# Patient Record
Sex: Male | Born: 1970 | Race: White | Hispanic: No | Marital: Married | Smoking: Current every day smoker
Health system: Southern US, Community
[De-identification: ages and names within clinical notes are randomized; demographics above are authoritative.]

## PROBLEM LIST (undated history)

## (undated) DIAGNOSIS — N200 Calculus of kidney: Secondary | ICD-10-CM

## (undated) DIAGNOSIS — K5730 Diverticulosis of large intestine without perforation or abscess without bleeding: Secondary | ICD-10-CM

## (undated) HISTORY — PX: APPENDECTOMY: SHX54

## (undated) HISTORY — PX: HERNIA REPAIR: SHX51

## (undated) HISTORY — PX: SHOULDER SURGERY: SHX246

## (undated) HISTORY — PX: APPENDECTOMY (OPEN): SHX54

## (undated) HISTORY — PX: ROTATOR CUFF REPAIR: SHX139

---

## 2005-05-25 ENCOUNTER — Emergency Department: Admit: 2005-05-25 | Payer: Self-pay | Source: Emergency Department

## 2010-11-17 ENCOUNTER — Emergency Department (INDEPENDENT_AMBULATORY_CARE_PROVIDER_SITE_OTHER): Payer: PRIVATE HEALTH INSURANCE

## 2010-11-17 ENCOUNTER — Emergency Department (HOSPITAL_BASED_OUTPATIENT_CLINIC_OR_DEPARTMENT_OTHER)
Admission: EM | Admit: 2010-11-17 | Discharge: 2010-11-17 | Disposition: A | Discharge status: Home / Self Care | Payer: PRIVATE HEALTH INSURANCE | Attending: Emergency Medicine | Admitting: Emergency Medicine

## 2010-11-17 DIAGNOSIS — N200 Calculus of kidney: Secondary | ICD-10-CM | POA: Insufficient documentation

## 2010-11-17 DIAGNOSIS — R109 Unspecified abdominal pain: Secondary | ICD-10-CM | POA: Insufficient documentation

## 2010-11-17 DIAGNOSIS — F17200 Nicotine dependence, unspecified, uncomplicated: Secondary | ICD-10-CM | POA: Insufficient documentation

## 2010-11-17 DIAGNOSIS — R110 Nausea: Secondary | ICD-10-CM | POA: Insufficient documentation

## 2010-11-17 LAB — DIFFERENTIAL
Eosinophils Absolute: 0.4 10*3/uL (ref 0.0–0.7)
Lymphocytes Relative: 42 % (ref 12–46)
Lymphs Abs: 5.3 10*3/uL — ABNORMAL HIGH (ref 0.7–4.0)
Monocytes Relative: 11 % (ref 3–12)
Neutrophils Relative %: 43 % (ref 43–77)

## 2010-11-17 LAB — URINALYSIS, ROUTINE W REFLEX MICROSCOPIC
Bilirubin Urine: NEGATIVE
Ketones, ur: NEGATIVE mg/dL
Leukocytes, UA: NEGATIVE
Protein, ur: NEGATIVE mg/dL
Urine Glucose, Fasting: NEGATIVE mg/dL
pH: 5 (ref 5.0–8.0)

## 2010-11-17 LAB — BASIC METABOLIC PANEL
BUN: 22 mg/dL (ref 6–23)
CO2: 22 mEq/L (ref 19–32)
Chloride: 110 mEq/L (ref 96–112)
Creatinine, Ser: 1.2 mg/dL (ref 0.4–1.5)
Glucose, Bld: 154 mg/dL — ABNORMAL HIGH (ref 70–99)
Potassium: 3.9 mEq/L (ref 3.5–5.1)

## 2010-11-17 LAB — CBC
HCT: 42.3 % (ref 39.0–52.0)
MCH: 30.4 pg (ref 26.0–34.0)
MCV: 88.1 fL (ref 78.0–100.0)
RBC: 4.8 MIL/uL (ref 4.22–5.81)
WBC: 12.5 10*3/uL — ABNORMAL HIGH (ref 4.0–10.5)

## 2010-11-17 LAB — URINE MICROSCOPIC-ADD ON

## 2011-09-29 ENCOUNTER — Emergency Department (INDEPENDENT_AMBULATORY_CARE_PROVIDER_SITE_OTHER)

## 2011-09-29 ENCOUNTER — Emergency Department (HOSPITAL_BASED_OUTPATIENT_CLINIC_OR_DEPARTMENT_OTHER)
Admission: EM | Admit: 2011-09-29 | Discharge: 2011-09-29 | Disposition: A | Discharge status: Home / Self Care | Attending: Emergency Medicine | Admitting: Emergency Medicine

## 2011-09-29 ENCOUNTER — Encounter: Payer: Self-pay | Admitting: Emergency Medicine

## 2011-09-29 DIAGNOSIS — M25569 Pain in unspecified knee: Secondary | ICD-10-CM | POA: Insufficient documentation

## 2011-09-29 DIAGNOSIS — X500XXA Overexertion from strenuous movement or load, initial encounter: Secondary | ICD-10-CM

## 2011-09-29 DIAGNOSIS — F17200 Nicotine dependence, unspecified, uncomplicated: Secondary | ICD-10-CM | POA: Insufficient documentation

## 2011-09-29 MED ORDER — HYDROCODONE-ACETAMINOPHEN 5-325 MG PO TABS
1.0000 | ORAL_TABLET | Freq: Once | ORAL | Status: AC
  Administered 2011-09-29: 1 via ORAL
  Filled 2011-09-29: qty 1

## 2011-09-29 MED ORDER — IBUPROFEN 800 MG PO TABS
ORAL_TABLET | ORAL | Status: AC
  Administered 2011-09-29: 800 mg
  Filled 2011-09-29: qty 1

## 2011-09-29 NOTE — ED Notes (Signed)
Refused crutches knee immobilizer applied with increased comfort

## 2011-09-29 NOTE — ED Notes (Signed)
Care plan and follow up reviewed 

## 2011-09-29 NOTE — ED Notes (Signed)
Pt ambulatory with pain to L knee this AM denies any trauma

## 2011-09-29 NOTE — ED Provider Notes (Signed)
History     CSN: [id number]  Arrival date & time 09/29/11  0805   First MD Initiated Contact with Patient 09/29/11 [phone]      Chief Complaint  Patient presents with  . Knee Pain    L knee pain after standing from squating position    (Consider location/radiation/quality/duration/timing/severity/associated sxs/prior treatment) HPI  40yoM presents with knee pain. Patient states that he was in a kneeling position last night at work and stood up on his left lower extremity and began to experience severe pain in his left knee. He rates pain as 5/10 at this time, inferolateral aspect of left knee. The pain is worse with walking. He denies numbness, tingling, weakness of his extremities. No history of prior injury. He is not immunocompromised and has not take any steroids. No history of arthritis. He has tried nothing at home for pain relief prior to arrival. He does have prescriptions for ibuprofen and Vicodin at home from  previous shoulder injury. He did not fall or have other injury.  History reviewed. No pertinent past medical history.  Past Surgical History  Procedure Date  . Shoulder surgery   . Hernia repair   . Appendectomy     History reviewed. No pertinent family history.  History  Substance Use Topics  . Smoking status: Current Everyday Smoker  . Smokeless tobacco: Not on file  . Alcohol Use: No      Review of Systems  All other systems reviewed and are negative.   except as noted HPI   Allergies  Review of patient's allergies indicates no known allergies.  Home Medications   Current Outpatient Rx  Name Route Sig Dispense Refill  . HYDROCODONE-ACETAMINOPHEN 5-325 MG PO TABS Oral Take 1 tablet by mouth every 6 (six) hours as needed.      . IBUPROFEN 800 MG PO TABS Oral Take 800 mg by mouth every 8 (eight) hours as needed.        BP 125/80  Pulse 78  Temp 98.6 F (37 C)  Resp 20  SpO2 99%  Physical Exam  Nursing note and vitals  reviewed. Constitutional: He is oriented to person, place, and time. He appears well-developed and well-nourished. No distress.  HENT:  Head: Atraumatic.  Mouth/Throat: Oropharynx is clear and moist.  Eyes: Conjunctivae are normal. Pupils are equal, round, and reactive to light.  Neck: Neck supple.  Cardiovascular: Normal rate, regular rhythm, normal heart sounds and intact distal pulses.  Exam reveals no gallop and no friction rub.   No murmur heard. Pulmonary/Chest: Effort normal. No respiratory distress. He has no wheezes. He has no rales.  Abdominal: Soft. Bowel sounds are normal. There is no tenderness. There is no rebound and no guarding.  Musculoskeletal: Normal range of motion. He exhibits no edema and no tenderness.       Lt knee without ecchymosis or deformity. No ttp. Full ROM with min pain. No crepitus. A/P drawer test unremarkable. Distal pulses intact. Gross sensation intact. Strength 5 out of 5 throughout left lower extremity. Capillary refill less than 3 seconds.  Neurological: He is alert and oriented to person, place, and time.  Skin: Skin is warm and dry.  Psychiatric: He has a normal mood and affect.    ED Course  Procedures (including critical care time)  Labs Reviewed - No data to display Dg Knee Complete 4 Views Left  09/29/2011  *RADIOLOGY REPORT*  Clinical Data: Left knee pain.  Twisting injury.  LEFT KNEE - COMPLETE 4+  VIEW  Comparison: None  Findings: No acute bony abnormality.  Specifically, no fracture, subluxation, or dislocation.  Soft tissues are intact.  No significant joint effusion.  IMPRESSION: No acute bony abnormality.  Original Report Authenticated By: Cyndie Chime, M.D.     1. Knee pain     MDM  XR unremarkable as above. Knee immobilizer, crutches, pain control. F/U with ortho or PMD if sx persist for possible MRI to evaluate ligaments/tendons/meninscus.        Forbes Cellar, MD 09/29/11 [phone]

## 2012-07-21 ENCOUNTER — Encounter (HOSPITAL_BASED_OUTPATIENT_CLINIC_OR_DEPARTMENT_OTHER): Payer: Self-pay | Admitting: *Deleted

## 2012-07-21 ENCOUNTER — Emergency Department (HOSPITAL_BASED_OUTPATIENT_CLINIC_OR_DEPARTMENT_OTHER)
Admission: EM | Admit: 2012-07-21 | Discharge: 2012-07-22 | Disposition: A | Discharge status: Home / Self Care | Payer: BC Managed Care – PPO | Attending: Emergency Medicine | Admitting: Emergency Medicine

## 2012-07-21 ENCOUNTER — Emergency Department (HOSPITAL_BASED_OUTPATIENT_CLINIC_OR_DEPARTMENT_OTHER): Payer: BC Managed Care – PPO

## 2012-07-21 DIAGNOSIS — K5792 Diverticulitis of intestine, part unspecified, without perforation or abscess without bleeding: Secondary | ICD-10-CM

## 2012-07-21 DIAGNOSIS — K5732 Diverticulitis of large intestine without perforation or abscess without bleeding: Secondary | ICD-10-CM | POA: Insufficient documentation

## 2012-07-21 DIAGNOSIS — R1032 Left lower quadrant pain: Secondary | ICD-10-CM | POA: Insufficient documentation

## 2012-07-21 LAB — COMPREHENSIVE METABOLIC PANEL
ALT: 25 U/L (ref 0–53)
Albumin: 3.6 g/dL (ref 3.5–5.2)
Alkaline Phosphatase: 36 U/L — ABNORMAL LOW (ref 39–117)
Chloride: 104 mEq/L (ref 96–112)
Glucose, Bld: 106 mg/dL — ABNORMAL HIGH (ref 70–99)
Potassium: 3.8 mEq/L (ref 3.5–5.1)
Sodium: 138 mEq/L (ref 135–145)
Total Protein: 6.4 g/dL (ref 6.0–8.3)

## 2012-07-21 LAB — CBC WITH DIFFERENTIAL/PLATELET
Eosinophils Absolute: 0.3 10*3/uL (ref 0.0–0.7)
Lymphs Abs: 3.3 10*3/uL (ref 0.7–4.0)
MCH: 31 pg (ref 26.0–34.0)
Neutro Abs: 6.9 10*3/uL (ref 1.7–7.7)
Neutrophils Relative %: 60 % (ref 43–77)
Platelets: 261 10*3/uL (ref 150–400)
RBC: 4.61 MIL/uL (ref 4.22–5.81)
WBC: 11.5 10*3/uL — ABNORMAL HIGH (ref 4.0–10.5)

## 2012-07-21 MED ORDER — MORPHINE SULFATE 4 MG/ML IJ SOLN
4.0000 mg | Freq: Once | INTRAMUSCULAR | Status: AC
  Administered 2012-07-21: 4 mg via INTRAVENOUS
  Filled 2012-07-21: qty 1

## 2012-07-21 MED ORDER — SODIUM CHLORIDE 0.9 % IV BOLUS (SEPSIS)
1000.0000 mL | Freq: Once | INTRAVENOUS | Status: AC
  Administered 2012-07-21: 1000 mL via INTRAVENOUS

## 2012-07-21 MED ORDER — ONDANSETRON HCL 4 MG/2ML IJ SOLN
4.0000 mg | Freq: Once | INTRAMUSCULAR | Status: AC
  Administered 2012-07-21: 4 mg via INTRAVENOUS
  Filled 2012-07-21: qty 2

## 2012-07-21 NOTE — ED Notes (Signed)
MD at bedside. 

## 2012-07-21 NOTE — ED Notes (Signed)
Drinking po contrast

## 2012-07-21 NOTE — ED Notes (Signed)
Left lower quad pain x 2 days. May be constipated per patient. No urinary complaints.

## 2012-07-22 LAB — URINALYSIS, ROUTINE W REFLEX MICROSCOPIC
Bilirubin Urine: NEGATIVE
Glucose, UA: NEGATIVE mg/dL
Ketones, ur: NEGATIVE mg/dL
pH: 5.5 (ref 5.0–8.0)

## 2012-07-22 MED ORDER — METRONIDAZOLE 500 MG PO TABS: 500.0000 mg | ORAL_TABLET | Freq: Two times a day (BID) | ORAL | Status: DC

## 2012-07-22 MED ORDER — IOHEXOL 300 MG/ML  SOLN
50.0000 mL | Freq: Once | INTRAMUSCULAR | Status: AC | PRN
  Administered 2012-07-22: 50 mL via ORAL

## 2012-07-22 MED ORDER — HYDROCODONE-ACETAMINOPHEN 5-500 MG PO TABS: 1.0000 | ORAL_TABLET | Freq: Four times a day (QID) | ORAL | Status: DC | PRN

## 2012-07-22 MED ORDER — METRONIDAZOLE 500 MG PO TABS
500.0000 mg | ORAL_TABLET | Freq: Once | ORAL | Status: AC
  Administered 2012-07-22: 500 mg via ORAL
  Filled 2012-07-22: qty 1

## 2012-07-22 MED ORDER — IOHEXOL 300 MG/ML  SOLN
100.0000 mL | Freq: Once | INTRAMUSCULAR | Status: AC | PRN
  Administered 2012-07-22: 100 mL via INTRAVENOUS

## 2012-07-22 MED ORDER — CIPROFLOXACIN HCL 500 MG PO TABS
500.0000 mg | ORAL_TABLET | Freq: Once | ORAL | Status: AC
  Administered 2012-07-22: 500 mg via ORAL
  Filled 2012-07-22: qty 1

## 2012-07-22 MED ORDER — CIPROFLOXACIN HCL 500 MG PO TABS: 500.0000 mg | ORAL_TABLET | Freq: Two times a day (BID) | ORAL | Status: DC

## 2012-07-22 NOTE — ED Notes (Signed)
Pt given a copy of his CT per MD request.

## 2012-07-22 NOTE — ED Notes (Signed)
Transported to CT 

## 2012-07-22 NOTE — ED Notes (Signed)
MD at bedside. 

## 2012-07-22 NOTE — ED Provider Notes (Signed)
History     CSN: [id number]  Arrival date & time 07/21/12  2208   First MD Initiated Contact with Patient 07/21/12 2300      Chief Complaint  Patient presents with  . Abdominal Pain    (Consider location/radiation/quality/duration/timing/severity/associated sxs/prior treatment) Patient is a 41 y.o. male presenting with abdominal pain. The history is provided by the patient.  Abdominal Pain The primary symptoms of the illness include abdominal pain. The primary symptoms of the illness do not include fever, nausea, vomiting or diarrhea. The current episode started 2 days ago. The onset of the illness was gradual. The problem has been gradually worsening.  The abdominal pain is located in the LLQ. The abdominal pain does not radiate. The severity of the abdominal pain is 6/10. The abdominal pain is relieved by nothing. The abdominal pain is exacerbated by eating.  The illness is associated with eating. The patient has not had a change in bowel habit. Symptoms associated with the illness do not include chills, anorexia, constipation, urgency, frequency or back pain. Significant associated medical issues do not include GERD. Associated medical issues comments: states that he has been having some "stomach problems".    History reviewed. No pertinent past medical history.  Past Surgical History  Procedure Date  . Shoulder surgery   . Hernia repair   . Appendectomy     No family history on file.  History  Substance Use Topics  . Smoking status: Current Every Day Smoker -- 0.5 packs/day  . Smokeless tobacco: Not on file  . Alcohol Use: No      Review of Systems  Constitutional: Negative for fever and chills.  Gastrointestinal: Positive for abdominal pain. Negative for nausea, vomiting, diarrhea, constipation and anorexia.  Genitourinary: Negative for urgency and frequency.  Musculoskeletal: Negative for back pain.  All other systems reviewed and are negative.    Allergies    Review of patient's allergies indicates no known allergies.  Home Medications   Current Outpatient Rx  Name Route Sig Dispense Refill  . CIPROFLOXACIN HCL 500 MG PO TABS Oral Take 1 tablet (500 mg total) by mouth every 12 (twelve) hours. 20 tablet 0  . HYDROCODONE-ACETAMINOPHEN 5-325 MG PO TABS Oral Take 1 tablet by mouth every 6 (six) hours as needed.      Marland Kitchen HYDROCODONE-ACETAMINOPHEN 5-500 MG PO TABS Oral Take 1-2 tablets by mouth every 6 (six) hours as needed for pain. 15 tablet 0  . IBUPROFEN 800 MG PO TABS Oral Take 800 mg by mouth every 8 (eight) hours as needed.      Marland Kitchen METRONIDAZOLE 500 MG PO TABS Oral Take 1 tablet (500 mg total) by mouth 2 (two) times daily. 20 tablet 0    BP 129/75  Pulse 74  Temp 98.2 F (36.8 C) (Oral)  Resp 18  SpO2 98%  Physical Exam  Nursing note and vitals reviewed. Constitutional: He is oriented to person, place, and time. He appears well-developed and well-nourished. No distress.  HENT:  Head: Normocephalic and atraumatic.  Mouth/Throat: Oropharynx is clear and moist.  Eyes: Conjunctivae normal and EOM are normal. Pupils are equal, round, and reactive to light.  Neck: Normal range of motion. Neck supple.  Cardiovascular: Normal rate, regular rhythm and intact distal pulses.   No murmur heard. Pulmonary/Chest: Effort normal and breath sounds normal. No respiratory distress. He has no wheezes. He has no rales.  Abdominal: Soft. Normal appearance. He exhibits no distension. There is tenderness in the left lower quadrant. There  is guarding. There is no rebound and no CVA tenderness.  Musculoskeletal: Normal range of motion. He exhibits no edema and no tenderness.  Neurological: He is alert and oriented to person, place, and time.  Skin: Skin is warm and dry. No rash noted. No erythema.  Psychiatric: He has a normal mood and affect. His behavior is normal.    ED Course  Procedures (including critical care time)  Labs Reviewed  CBC WITH  DIFFERENTIAL - Abnormal; Notable for the following:    WBC 11.5 (*)     All other components within normal limits  COMPREHENSIVE METABOLIC PANEL - Abnormal; Notable for the following:    Glucose, Bld 106 (*)     Alkaline Phosphatase 36 (*)     Total Bilirubin 0.2 (*)     GFR calc non Af Amer 81 (*)     All other components within normal limits  URINALYSIS, ROUTINE W REFLEX MICROSCOPIC   Ct Abdomen Pelvis W Contrast  07/22/2012  *RADIOLOGY REPORT*  Clinical Data: Left lower quadrant pain; question diverticulitis. Hernia repair and appendectomy.  CT ABDOMEN AND PELVIS WITH CONTRAST  Technique:  Multidetector CT imaging of the abdomen and pelvis was performed following the standard protocol during bolus administration of intravenous contrast.  Contrast: OMNIPAQUE IOHEXOL 300 MG/ML  SOLN, 50mL OMNIPAQUE IOHEXOL 300 MG/ML  SOLN  Comparison: 11/17/2010  Findings: Clear lung bases.  Mild cardiomegaly, without pericardial or pleural effusion.  Normal liver, spleen.  A splenule.  Normal stomach, pancreas, gallbladder, biliary tract, adrenal glands. Lower pole right renal cyst.  Resolution of right-sided hydronephrosis.  Small retroperitoneal nodes which are not significantly changed and likely reactive.  Minimal diverticulosis.  Inflammation involving the distal descending colon, including on image 65.  No drainable abscess or evidence of free perforation. Normal terminal ileum.  Appendix surgically absent. Normal small bowel without abdominal ascites.    No pelvic adenopathy.    Normal urinary bladder and prostate.  No significant free fluid.  No acute osseous abnormality.  IMPRESSION: Uncomplicated descending diverticulitis.   Original Report Authenticated By: Consuello Bossier, M.D.      1. Diverticulitis       MDM   Patient with abdominal pain in the left lower quadrant symptoms concerning for diverticulitis. Labs show a mild leukocytosis of 11,000 but otherwise normal hemoglobin normal liver  enzymes and normal UA. CT showed uncomplicated diverticulitis. Patient was placed on Cipro and Flagyl and given pain control. He is currently not had any vomiting and is tolerating by mouth's. He has followup with GI on October 25 and was given strict return precautions        Gwyneth Sprout, MD 07/22/12 0205

## 2012-07-22 NOTE — ED Notes (Signed)
Returned from CT.

## 2013-01-26 ENCOUNTER — Other Ambulatory Visit: Payer: Self-pay | Admitting: Gastroenterology

## 2013-01-26 DIAGNOSIS — R1032 Left lower quadrant pain: Secondary | ICD-10-CM

## 2013-01-27 ENCOUNTER — Ambulatory Visit
Admission: RE | Admit: 2013-01-27 | Discharge: 2013-01-27 | Disposition: A | Discharge status: Home / Self Care | Payer: Federal, State, Local not specified - PPO | Source: Ambulatory Visit | Attending: Gastroenterology | Admitting: Gastroenterology

## 2013-01-27 DIAGNOSIS — R1032 Left lower quadrant pain: Secondary | ICD-10-CM

## 2013-01-27 MED ORDER — IOHEXOL 300 MG/ML  SOLN
125.0000 mL | Freq: Once | INTRAMUSCULAR | Status: AC | PRN
  Administered 2013-01-27: 125 mL via INTRAVENOUS

## 2013-05-27 ENCOUNTER — Emergency Department (HOSPITAL_BASED_OUTPATIENT_CLINIC_OR_DEPARTMENT_OTHER): Payer: Federal, State, Local not specified - PPO

## 2013-05-27 ENCOUNTER — Emergency Department (HOSPITAL_BASED_OUTPATIENT_CLINIC_OR_DEPARTMENT_OTHER)
Admission: EM | Admit: 2013-05-27 | Discharge: 2013-05-27 | Disposition: A | Discharge status: Home / Self Care | Payer: Federal, State, Local not specified - PPO | Attending: Emergency Medicine | Admitting: Emergency Medicine

## 2013-05-27 ENCOUNTER — Encounter (HOSPITAL_BASED_OUTPATIENT_CLINIC_OR_DEPARTMENT_OTHER): Payer: Self-pay | Admitting: *Deleted

## 2013-05-27 DIAGNOSIS — R197 Diarrhea, unspecified: Secondary | ICD-10-CM | POA: Insufficient documentation

## 2013-05-27 DIAGNOSIS — F17200 Nicotine dependence, unspecified, uncomplicated: Secondary | ICD-10-CM | POA: Insufficient documentation

## 2013-05-27 DIAGNOSIS — R112 Nausea with vomiting, unspecified: Secondary | ICD-10-CM | POA: Insufficient documentation

## 2013-05-27 DIAGNOSIS — Z791 Long term (current) use of non-steroidal anti-inflammatories (NSAID): Secondary | ICD-10-CM | POA: Insufficient documentation

## 2013-05-27 DIAGNOSIS — N200 Calculus of kidney: Secondary | ICD-10-CM | POA: Insufficient documentation

## 2013-05-27 DIAGNOSIS — R630 Anorexia: Secondary | ICD-10-CM | POA: Insufficient documentation

## 2013-05-27 DIAGNOSIS — Z8719 Personal history of other diseases of the digestive system: Secondary | ICD-10-CM | POA: Insufficient documentation

## 2013-05-27 DIAGNOSIS — R300 Dysuria: Secondary | ICD-10-CM | POA: Insufficient documentation

## 2013-05-27 DIAGNOSIS — Z79899 Other long term (current) drug therapy: Secondary | ICD-10-CM | POA: Insufficient documentation

## 2013-05-27 DIAGNOSIS — M549 Dorsalgia, unspecified: Secondary | ICD-10-CM | POA: Insufficient documentation

## 2013-05-27 HISTORY — DX: Diverticulosis of large intestine without perforation or abscess without bleeding: K57.30

## 2013-05-27 HISTORY — DX: Calculus of kidney: N20.0

## 2013-05-27 LAB — COMPREHENSIVE METABOLIC PANEL
Albumin: 3.6 g/dL (ref 3.5–5.2)
Alkaline Phosphatase: 38 U/L — ABNORMAL LOW (ref 39–117)
BUN: 15 mg/dL (ref 6–23)
Calcium: 10.1 mg/dL (ref 8.4–10.5)
Creatinine, Ser: 1.1 mg/dL (ref 0.50–1.35)
GFR calc Af Amer: >90 mL/min (ref >90)
Glucose, Bld: 142 mg/dL — ABNORMAL HIGH (ref 70–99)
Potassium: 4.3 mEq/L (ref 3.5–5.1)
Total Protein: 6.6 g/dL (ref 6.0–8.3)

## 2013-05-27 LAB — CBC WITH DIFFERENTIAL/PLATELET
Basophils Relative: 1 % (ref 0–1)
Eosinophils Absolute: 0.2 10*3/uL (ref 0.0–0.7)
Eosinophils Relative: 3 % (ref 0–5)
Hemoglobin: 15.1 g/dL (ref 13.0–17.0)
Lymphs Abs: 2.6 10*3/uL (ref 0.7–4.0)
MCH: 31.3 pg (ref 26.0–34.0)
MCHC: 34.3 g/dL (ref 30.0–36.0)
MCV: 91.1 fL (ref 78.0–100.0)
Monocytes Absolute: 0.9 10*3/uL (ref 0.1–1.0)
Monocytes Relative: 11 % (ref 3–12)
Neutrophils Relative %: 55 % (ref 43–77)
RBC: 4.83 MIL/uL (ref 4.22–5.81)

## 2013-05-27 LAB — URINALYSIS, ROUTINE W REFLEX MICROSCOPIC
Bilirubin Urine: NEGATIVE
Hgb urine dipstick: NEGATIVE
Ketones, ur: NEGATIVE mg/dL
Nitrite: NEGATIVE
pH: 5.5 (ref 5.0–8.0)

## 2013-05-27 MED ORDER — SODIUM CHLORIDE 0.9 % IV BOLUS (SEPSIS)
1000.0000 mL | Freq: Once | INTRAVENOUS | Status: AC
  Administered 2013-05-27: 1000 mL via INTRAVENOUS

## 2013-05-27 MED ORDER — TAMSULOSIN HCL 0.4 MG PO CAPS: 0.4000 mg | ORAL_CAPSULE | Freq: Every day | ORAL | Status: DC

## 2013-05-27 MED ORDER — KETOROLAC TROMETHAMINE 30 MG/ML IJ SOLN
30.0000 mg | Freq: Once | INTRAMUSCULAR | Status: AC
  Administered 2013-05-27: 30 mg via INTRAVENOUS
  Filled 2013-05-27: qty 1

## 2013-05-27 MED ORDER — ONDANSETRON HCL 4 MG PO TABS: 4.0000 mg | ORAL_TABLET | Freq: Four times a day (QID) | ORAL | Status: AC

## 2013-05-27 MED ORDER — HYDROMORPHONE HCL PF 1 MG/ML IJ SOLN
1.0000 mg | Freq: Once | INTRAMUSCULAR | Status: AC
  Administered 2013-05-27: 1 mg via INTRAVENOUS
  Filled 2013-05-27: qty 1

## 2013-05-27 MED ORDER — IBUPROFEN 800 MG PO TABS: 800.0000 mg | ORAL_TABLET | Freq: Three times a day (TID) | ORAL | Status: DC

## 2013-05-27 MED ORDER — OXYCODONE-ACETAMINOPHEN 5-325 MG PO TABS: 2.0000 | ORAL_TABLET | ORAL | Status: DC | PRN

## 2013-05-27 MED ORDER — ONDANSETRON HCL 4 MG/2ML IJ SOLN
4.0000 mg | Freq: Once | INTRAMUSCULAR | Status: AC
  Administered 2013-05-27: 4 mg via INTRAVENOUS
  Filled 2013-05-27: qty 2

## 2013-05-27 NOTE — ED Notes (Signed)
Patient states he developed left lower abdominal pain after having a bowel movement about two hours pta.  Pain is associated with decreased urine output for the last several days.  Pain is tight in the LLQ radiating into his back.

## 2013-05-27 NOTE — ED Provider Notes (Signed)
CSN: [id number]     Arrival date & time 05/27/13  1000 History     First MD Initiated Contact with Patient 05/27/13 1039     Chief Complaint  Patient presents with  . Abdominal Pain   (Consider location/radiation/quality/duration/timing/severity/associated sxs/prior Treatment) HPI Comments: Patient presents with severe left lower quadrant pain that onset while he was trying to have a bowel movement 1 hour ago. The pain is constant and does not radiate. He feels like he cannot urinate but needs to. Denies any testicular pain. Pain is associated with vomiting. Nothing makes it better or worse. He has a history of both diverticulitis and kidney stones. He had a colonoscopy 10 days ago which is not showing diverticula and a polyp was removed. Denies any dysuria or hematuria. States he felt nauseated and had some diarrhea over the past several days denies any back pain or testicular pain. Took a Vicodin at home without relief.  The history is provided by the patient.    Past Medical History  Diagnosis Date  . Kidney stone   . Diverticula of colon    Past Surgical History  Procedure Laterality Date  . Shoulder surgery    . Hernia repair    . Appendectomy     No family history on file. History  Substance Use Topics  . Smoking status: Current Every Day Smoker -- 0.50 packs/day  . Smokeless tobacco: Not on file  . Alcohol Use: No    Review of Systems  Constitutional: Positive for activity change and appetite change. Negative for fever.  HENT: Negative for congestion and rhinorrhea.   Respiratory: Negative for cough, chest tightness and shortness of breath.   Cardiovascular: Negative for chest pain.  Gastrointestinal: Positive for nausea, vomiting, abdominal pain and diarrhea. Negative for constipation.  Genitourinary: Positive for flank pain and difficulty urinating. Negative for dysuria, hematuria and testicular pain.  Musculoskeletal: Positive for back pain.  Skin: Negative for  rash.  Neurological: Negative for dizziness, weakness and headaches.  A complete 10 system review of systems was obtained and all systems are negative except as noted in the HPI and PMH.    Allergies  Review of patient's allergies indicates no known allergies.  Home Medications   Current Outpatient Rx  Name  Route  Sig  Dispense  Refill  . HYDROcodone-acetaminophen (NORCO/VICODIN) 5-325 MG per tablet   Oral   Take 2 tablets by mouth every 6 (six) hours as needed for pain.         . ciprofloxacin (CIPRO) 500 MG tablet   Oral   Take 1 tablet (500 mg total) by mouth every 12 (twelve) hours.   20 tablet   0   . HYDROcodone-acetaminophen (NORCO) 5-325 MG per tablet   Oral   Take 1 tablet by mouth every 6 (six) hours as needed.           Marland Kitchen HYDROcodone-acetaminophen (VICODIN) 5-500 MG per tablet   Oral   Take 1-2 tablets by mouth every 6 (six) hours as needed for pain.   15 tablet   0   . ibuprofen (ADVIL,MOTRIN) 800 MG tablet   Oral   Take 800 mg by mouth every 8 (eight) hours as needed.           Marland Kitchen ibuprofen (ADVIL,MOTRIN) 800 MG tablet   Oral   Take 1 tablet (800 mg total) by mouth 3 (three) times daily.   21 tablet   0   . metroNIDAZOLE (FLAGYL) 500 MG tablet  Oral   Take 1 tablet (500 mg total) by mouth 2 (two) times daily.   20 tablet   0   . ondansetron (ZOFRAN) 4 MG tablet   Oral   Take 1 tablet (4 mg total) by mouth every 6 (six) hours.   12 tablet   0   . oxyCODONE-acetaminophen (PERCOCET/ROXICET) 5-325 MG per tablet   Oral   Take 2 tablets by mouth every 4 (four) hours as needed for pain.   6 tablet   0   . tamsulosin (FLOMAX) 0.4 MG CAPS capsule   Oral   Take 1 capsule (0.4 mg total) by mouth daily.   10 capsule   0    BP 130/74  Temp(Src) 98.7 F (37.1 C) (Oral)  Resp 20  Ht 6' (1.829 m)  Wt 280 lb (127.007 kg)  BMI 37.97 kg/m2  SpO2 100% Physical Exam  Constitutional: He is oriented to person, place, and time. He appears  well-developed and well-nourished. He appears distressed.  Uncomfortable, writhing on bed  HENT:  Head: Normocephalic and atraumatic.  Mouth/Throat: Oropharynx is clear and moist. No oropharyngeal exudate.  Eyes: Conjunctivae and EOM are normal. Pupils are equal, round, and reactive to light.  Neck: Normal range of motion. Neck supple.  Cardiovascular: Normal rate, regular rhythm and normal heart sounds.   No murmur heard. Pulmonary/Chest: Effort normal and breath sounds normal. No respiratory distress.  Abdominal: Soft. There is tenderness. There is guarding. There is no rebound.  TTP LLQ with guarding.  Genitourinary:  No testicular tenderness  Musculoskeletal: Normal range of motion. He exhibits no edema and no tenderness.  No CVAT  Neurological: He is alert and oriented to person, place, and time. No cranial nerve deficit. He exhibits normal muscle tone. Coordination normal.  CN 2-12 intact, no ataxia on finger to nose, no nystagmus, 5/5 strength throughout, no pronator drift, Romberg negative, normal gait.   Skin: Skin is warm.    ED Course   Procedures (including critical care time)  Labs Reviewed  COMPREHENSIVE METABOLIC PANEL - Abnormal; Notable for the following:    Glucose, Bld 142 (*)    Alkaline Phosphatase 38 (*)    Total Bilirubin 0.2 (*)    GFR calc non Af Amer 81 (*)    All other components within normal limits  URINALYSIS, ROUTINE W REFLEX MICROSCOPIC  CBC WITH DIFFERENTIAL   Ct Abdomen Pelvis Wo Contrast  05/27/2013   *RADIOLOGY REPORT*  Clinical Data: Left flank pain  CT ABDOMEN AND PELVIS WITHOUT CONTRAST  Technique:  Multidetector CT imaging of the abdomen and pelvis was performed following the standard protocol without intravenous contrast.  Comparison: CT 01/27/2013  Findings: Mild obstruction of the left kidney with mild perinephric edema and mild peri ureteral edema.  There is a tiny 1 mm stone in the distal left ureter causing partial obstruction.  Right  kidney is normal.  No other renal stones are identified.  Lung bases are clear.  The liver and gallbladder are normal. Pancreas and spleen are normal.  Negative for bowel obstruction or bowel thickening.  No free fluid. Negative for adenopathy.  IMPRESSION: 1 mm partially obstructing stone distal left ureter.   Original Report Authenticated By: Janeece Riggers, M.D.   1. Nephrolithiasis     MDM  LLQ abdominal pain with decreased urine output.  Associated with nausea.  Hx kidney stone and diverticulitis.  Pain improved with meds in the ED. Able to urinate on own.  UA negative. CT shows  tiny 1mm stone partially obstructing stone.  No evidence of diverticulitis. Pain controlled in the ED, tolerating PO.  Stable for followup with urology.  Glynn Octave, MD 05/27/13 [phone]

## 2013-05-27 NOTE — ED Notes (Signed)
Patient transported to CT 

## 2013-07-02 ENCOUNTER — Emergency Department (HOSPITAL_BASED_OUTPATIENT_CLINIC_OR_DEPARTMENT_OTHER)
Admission: EM | Admit: 2013-07-02 | Discharge: 2013-07-02 | Disposition: A | Discharge status: Home / Self Care | Attending: Emergency Medicine | Admitting: Emergency Medicine

## 2013-07-02 ENCOUNTER — Encounter (HOSPITAL_BASED_OUTPATIENT_CLINIC_OR_DEPARTMENT_OTHER): Payer: Self-pay | Admitting: *Deleted

## 2013-07-02 ENCOUNTER — Emergency Department (HOSPITAL_BASED_OUTPATIENT_CLINIC_OR_DEPARTMENT_OTHER)

## 2013-07-02 DIAGNOSIS — Z79899 Other long term (current) drug therapy: Secondary | ICD-10-CM | POA: Insufficient documentation

## 2013-07-02 DIAGNOSIS — Y9389 Activity, other specified: Secondary | ICD-10-CM | POA: Insufficient documentation

## 2013-07-02 DIAGNOSIS — Z8719 Personal history of other diseases of the digestive system: Secondary | ICD-10-CM | POA: Insufficient documentation

## 2013-07-02 DIAGNOSIS — Y990 Civilian activity done for income or pay: Secondary | ICD-10-CM | POA: Insufficient documentation

## 2013-07-02 DIAGNOSIS — IMO0002 Reserved for concepts with insufficient information to code with codable children: Secondary | ICD-10-CM | POA: Insufficient documentation

## 2013-07-02 DIAGNOSIS — X500XXA Overexertion from strenuous movement or load, initial encounter: Secondary | ICD-10-CM | POA: Insufficient documentation

## 2013-07-02 DIAGNOSIS — Y9289 Other specified places as the place of occurrence of the external cause: Secondary | ICD-10-CM | POA: Insufficient documentation

## 2013-07-02 DIAGNOSIS — S8391XA Sprain of unspecified site of right knee, initial encounter: Secondary | ICD-10-CM

## 2013-07-02 DIAGNOSIS — F17200 Nicotine dependence, unspecified, uncomplicated: Secondary | ICD-10-CM | POA: Insufficient documentation

## 2013-07-02 DIAGNOSIS — Z87442 Personal history of urinary calculi: Secondary | ICD-10-CM | POA: Insufficient documentation

## 2013-07-02 MED ORDER — IBUPROFEN 800 MG PO TABS
800.0000 mg | ORAL_TABLET | Freq: Once | ORAL | Status: AC
  Administered 2013-07-02: 800 mg via ORAL
  Filled 2013-07-02: qty 1

## 2013-07-02 MED ORDER — IBUPROFEN 800 MG PO TABS: 800.0000 mg | ORAL_TABLET | Freq: Three times a day (TID) | ORAL | Status: AC

## 2013-07-02 NOTE — ED Provider Notes (Addendum)
CSN: [id number]     Arrival date & time 07/02/13  1308 History   First MD Initiated Contact with Patient 07/02/13 [phone]     Chief Complaint  Patient presents with  . Knee Pain   (Consider location/radiation/quality/duration/timing/severity/associated sxs/prior Treatment) HPI Comments: 42 year old male presents 22 hours after injuring his right knee. [place]. He states that he was moving over the chairs to sit his foot got caught in the bottom of the chair and his right knee twisted. The pain has mildly worsened since that time. He states that he has noticed some mild swelling which is improved with ice at home. He is able to ambulate but it hurts worse to ambulate he states the pain is essentially 0 when he is at rest but hurts to move. Pain is a 4 or 5 upon moving, especially pending. The pain hurts worse in the lateral inferior aspect of his knee. Denies any weakness or numbness. States he did not think it was dislocated. No other trauma noted.  The history is provided by the patient.    Past Medical History  Diagnosis Date  . Kidney stone   . Diverticula of colon    Past Surgical History  Procedure Laterality Date  . Shoulder surgery    . Hernia repair    . Appendectomy     No family history on file. History  Substance Use Topics  . Smoking status: Current Every Day Smoker -- 0.50 packs/day  . Smokeless tobacco: Not on file  . Alcohol Use: No    Review of Systems  Musculoskeletal: Positive for joint swelling.  Skin: Negative for color change and wound.  Neurological: Negative for weakness and numbness.  All other systems reviewed and are negative.    Allergies  Review of patient's allergies indicates no known allergies.  Home Medications   Current Outpatient Rx  Name  Route  Sig  Dispense  Refill  . ciprofloxacin (CIPRO) 500 MG tablet   Oral   Take 1 tablet (500 mg total) by mouth every 12 (twelve) hours.   20 tablet   0    . HYDROcodone-acetaminophen (NORCO) 5-325 MG per tablet   Oral   Take 1 tablet by mouth every 6 (six) hours as needed.           Marland Kitchen HYDROcodone-acetaminophen (NORCO/VICODIN) 5-325 MG per tablet   Oral   Take 2 tablets by mouth every 6 (six) hours as needed for pain.         Marland Kitchen HYDROcodone-acetaminophen (VICODIN) 5-500 MG per tablet   Oral   Take 1-2 tablets by mouth every 6 (six) hours as needed for pain.   15 tablet   0   . ibuprofen (ADVIL,MOTRIN) 800 MG tablet   Oral   Take 800 mg by mouth every 8 (eight) hours as needed.           Marland Kitchen ibuprofen (ADVIL,MOTRIN) 800 MG tablet   Oral   Take 1 tablet (800 mg total) by mouth 3 (three) times daily.   21 tablet   0   . metroNIDAZOLE (FLAGYL) 500 MG tablet   Oral   Take 1 tablet (500 mg total) by mouth 2 (two) times daily.   20 tablet   0   . ondansetron (ZOFRAN) 4 MG tablet   Oral   Take 1 tablet (4 mg total) by mouth every 6 (six) hours.   12 tablet   0   . oxyCODONE-acetaminophen (  PERCOCET/ROXICET) 5-325 MG per tablet   Oral   Take 2 tablets by mouth every 4 (four) hours as needed for pain.   6 tablet   0   . tamsulosin (FLOMAX) 0.4 MG CAPS capsule   Oral   Take 1 capsule (0.4 mg total) by mouth daily.   10 capsule   0    BP 135/70  Pulse 77  Temp(Src) 98 F (36.7 C) (Oral)  Resp 16  SpO2 99% Physical Exam  Nursing note and vitals reviewed. Constitutional: He is oriented to person, place, and time. He appears well-developed and well-nourished.  HENT:  Head: Normocephalic and atraumatic.  Eyes: Right eye exhibits no discharge. Left eye exhibits no discharge.  Cardiovascular:  Pulses:      Dorsalis pedis pulses are 2+ on the right side, and 2+ on the left side.  Pulmonary/Chest: Effort normal.  Abdominal: Soft. He exhibits no distension.  Musculoskeletal:       Right knee: He exhibits swelling (mild). He exhibits normal range of motion, no deformity, no laceration, no erythema and normal alignment.  No tenderness found.  Neurological: He is alert and oriented to person, place, and time. He has normal strength. No sensory deficit. He exhibits normal muscle tone.  Walks with a mild limp but is able to bear weight  Skin: Skin is warm and dry. No erythema.    ED Course  Procedures (including critical care time) Labs Review Labs Reviewed - No data to display Imaging Review Dg Knee Complete 4 Views Right  07/02/2013   *RADIOLOGY REPORT*  Clinical Data: Right knee pain after injury.  RIGHT KNEE - COMPLETE 4+ VIEW  Comparison: None.  Findings: No fracture or dislocation is noted.  Joint spaces are intact. No soft tissue abnormality is noted.  No significant joint effusion is noted.  IMPRESSION: Normal right knee.   Original Report Authenticated By: Lupita Raider.,  M.D.    MDM   1. Right knee sprain, initial encounter    X-ray negative for a fracture. This is most consistent with a right knee sprain. He has a normal NV exam. Will treat with ice and NSAIDs. Patient has a knee immobilizer at home from a prior knee injury. He also has a knee sleeve that he can use. We'll have him followup with his PCP in 1-2 weeks for outpatient MRI if his pain continues.    Audree Camel, MD 07/02/13 1610  Audree Camel, MD 07/02/13 [phone]

## 2013-07-02 NOTE — ED Notes (Signed)
Per Pt reports while at work this morning, sat down in high back chair and right knee twisted. Painful when bending.

## 2016-01-19 ENCOUNTER — Emergency Department (HOSPITAL_BASED_OUTPATIENT_CLINIC_OR_DEPARTMENT_OTHER): Payer: Federal, State, Local not specified - PPO

## 2016-01-19 ENCOUNTER — Encounter (HOSPITAL_BASED_OUTPATIENT_CLINIC_OR_DEPARTMENT_OTHER): Payer: Self-pay | Admitting: *Deleted

## 2016-01-19 ENCOUNTER — Emergency Department (HOSPITAL_BASED_OUTPATIENT_CLINIC_OR_DEPARTMENT_OTHER)
Admission: EM | Admit: 2016-01-19 | Discharge: 2016-01-19 | Disposition: A | Discharge status: Home / Self Care | Payer: Federal, State, Local not specified - PPO | Attending: Emergency Medicine | Admitting: Emergency Medicine

## 2016-01-19 DIAGNOSIS — Z79899 Other long term (current) drug therapy: Secondary | ICD-10-CM | POA: Insufficient documentation

## 2016-01-19 DIAGNOSIS — F17200 Nicotine dependence, unspecified, uncomplicated: Secondary | ICD-10-CM | POA: Diagnosis not present

## 2016-01-19 DIAGNOSIS — M545 Low back pain: Secondary | ICD-10-CM | POA: Insufficient documentation

## 2016-01-19 DIAGNOSIS — M546 Pain in thoracic spine: Secondary | ICD-10-CM | POA: Insufficient documentation

## 2016-01-19 DIAGNOSIS — M549 Dorsalgia, unspecified: Secondary | ICD-10-CM

## 2016-01-19 MED ORDER — OXYCODONE-ACETAMINOPHEN 5-325 MG PO TABS
2.0000 | ORAL_TABLET | Freq: Once | ORAL | Status: AC
  Administered 2016-01-19: 2 via ORAL
  Filled 2016-01-19: qty 2

## 2016-01-19 MED ORDER — OXYCODONE-ACETAMINOPHEN 5-325 MG PO TABS: 1.0000 | ORAL_TABLET | ORAL | Status: AC | PRN

## 2016-01-19 NOTE — ED Provider Notes (Signed)
CSN: [id number]     Arrival date & time 01/19/16  1708 History   First MD Initiated Contact with Patient 01/19/16 1740     Chief Complaint  Patient presents with  . Back Pain     (Consider location/radiation/quality/duration/timing/severity/associated sxs/prior Treatment) Patient is a 45 y.o. male presenting with back pain. The history is provided by medical records and the patient.  Back Pain  45 year old male with history of kidney stones, presenting to the ED for low back pain for the past 8 weeks. Patient states he initially injured his back while working with the [employer] unit at work.  He states he saw his PCP and was put on a muscle relaxer and Motrin without any improvement. He states he was switched to Flexeril which did seem to help until about 2 weeks ago. He states he bent down to check a suspect ankle at work and felt a "jerk" in his low back. He states it eased off after a few hours, but has had intermittent "spasms" in his back since this time. He states today he was eating lunch and went to sit on a stool and was unable to stand up due to pain in his back. Pain is mostly localized to his right mid and lower back. He denies any recent heavy lifting. No direct falls or trauma.  He denies any numbness or weakness of his legs. No bowel or bladder incontinence. No urinary symptoms such as dysuria, hematuria, urinary frequency. States pain is worse when changing from sitting to standing position. No pain with movement of the legs. Denies any history of back injuries or surgeries in the past. No fever or chills.  VSS.  Past Medical History  Diagnosis Date  . Kidney stone   . Diverticula of colon    Past Surgical History  Procedure Laterality Date  . Shoulder surgery    . Hernia repair    . Appendectomy     History reviewed. No pertinent family history. Social History  Substance Use Topics  . Smoking status: Current Every Day Smoker -- 0.50 packs/day  . Smokeless tobacco: None  .  Alcohol Use: No    Review of Systems  Musculoskeletal: Positive for back pain.  All other systems reviewed and are negative.     Allergies  Review of patient's allergies indicates no known allergies.  Home Medications   Prior to Admission medications   Medication Sig Start Date End Date Taking? Authorizing Provider  cyclobenzaprine (FLEXERIL) 10 MG tablet Take 10 mg by mouth 3 (three) times daily as needed for muscle spasms.   Yes Historical Provider, MD  HYDROcodone-acetaminophen (NORCO) 5-325 MG per tablet Take 1 tablet by mouth every 6 (six) hours as needed.      Historical Provider, MD  HYDROcodone-acetaminophen (NORCO/VICODIN) 5-325 MG per tablet Take 2 tablets by mouth every 6 (six) hours as needed for pain.    Historical Provider, MD  ibuprofen (ADVIL,MOTRIN) 800 MG tablet Take 800 mg by mouth every 8 (eight) hours as needed.      Historical Provider, MD  ibuprofen (ADVIL,MOTRIN) 800 MG tablet Take 1 tablet (800 mg total) by mouth 3 (three) times daily. 07/02/13   Pricilla Loveless, MD  ondansetron (ZOFRAN) 4 MG tablet Take 1 tablet (4 mg total) by mouth every 6 (six) hours. 05/27/13   Glynn Octave, MD  oxyCODONE-acetaminophen (PERCOCET/ROXICET) 5-325 MG per tablet Take 2 tablets by mouth every 4 (four) hours as needed for pain. 05/27/13   Glynn Octave, MD  BP 125/75 mmHg  Pulse 94  Temp(Src) 98 F (36.7 C) (Oral)  Resp 16  Ht 6' (1.829 m)  Wt 133.358 kg  BMI 39.86 kg/m2  SpO2 98%   Physical Exam  Constitutional: He is oriented to person, place, and time. He appears well-developed and well-nourished. No distress.  HENT:  Head: Normocephalic and atraumatic.  Mouth/Throat: Oropharynx is clear and moist.  Eyes: Conjunctivae and EOM are normal. Pupils are equal, round, and reactive to light.  Neck: Normal range of motion. Neck supple.  Cardiovascular: Normal rate, regular rhythm and normal heart sounds.   Pulmonary/Chest: Effort normal and breath sounds normal. No  respiratory distress. He has no wheezes.  Musculoskeletal: Normal range of motion. He exhibits no edema.       Back:  TTP of right thoracic and lumbar paraspinal region without noted deformities; no midline step-off or deformities; full ROM maintained; negative SLR bilaterally; normal gait; discomfort observed when transitioning from sitting to standing position, normal strength and sensation of all 4 extremities, normal gait  Neurological: He is alert and oriented to person, place, and time.  Skin: Skin is warm and dry. He is not diaphoretic.  Psychiatric: He has a normal mood and affect.  Nursing note and vitals reviewed.   ED Course  Procedures (including critical care time) Labs Review Labs Reviewed - No data to display  Imaging Review No results found. I have personally reviewed and evaluated these images and lab results as part of my medical decision-making.   EKG Interpretation None      MDM   Final diagnoses:  Back pain, unspecified location   45 year old male here with right mid and lower back pain for the past 8 weeks. He has seen his PCP for this previously, symptoms persist. On exam patient has tenderness of his right thoracic and lumbar paraspinal region without midline step-off or deformity. He has negative straight leg raise bilaterally. No focal neurologic deficits to suggest cauda equina. Patient remains ambulatory with steady gait. He was treated in the ED with Percocet with improvement of his symptoms. His x-rays are negative for acute findings. In the process of referring him to physical therapy. He is also seen orthopedics in the past, Dr. Luiz BlareGraves, may wish to follow up with them as well.  Short supply percocet for home.  Discussed plan with patient, he/she acknowledged understanding and agreed with plan of care.  Return precautions given for new or worsening symptoms.  Garlon HatchetLisa M Jerrol Helmers, PA-C 01/19/16 1957  Alvira MondayErin Schlossman, MD 01/21/16 1420

## 2016-01-19 NOTE — Discharge Instructions (Signed)
Take the prescribed medication as directed.   Follow-up with your primary care physician.  May wish to go through with your physical therapy.  May also wish to see orthopedics if continue having issues. Return to the ED for new or worsening symptoms.

## 2016-01-19 NOTE — ED Notes (Signed)
Pt c/o lower back pain x 8 weeks , seen by PMD for same

## 2016-04-18 IMAGING — DX DG THORACIC SPINE 2V
4 series · 4 of 4 positions shown · non-contrast
Comparison: None.

CLINICAL DATA: Chronic upper and lower back pain for 8 weeks.
Initial encounter.

EXAM:
THORACIC SPINE 2 VIEWS

[t-spine ap]
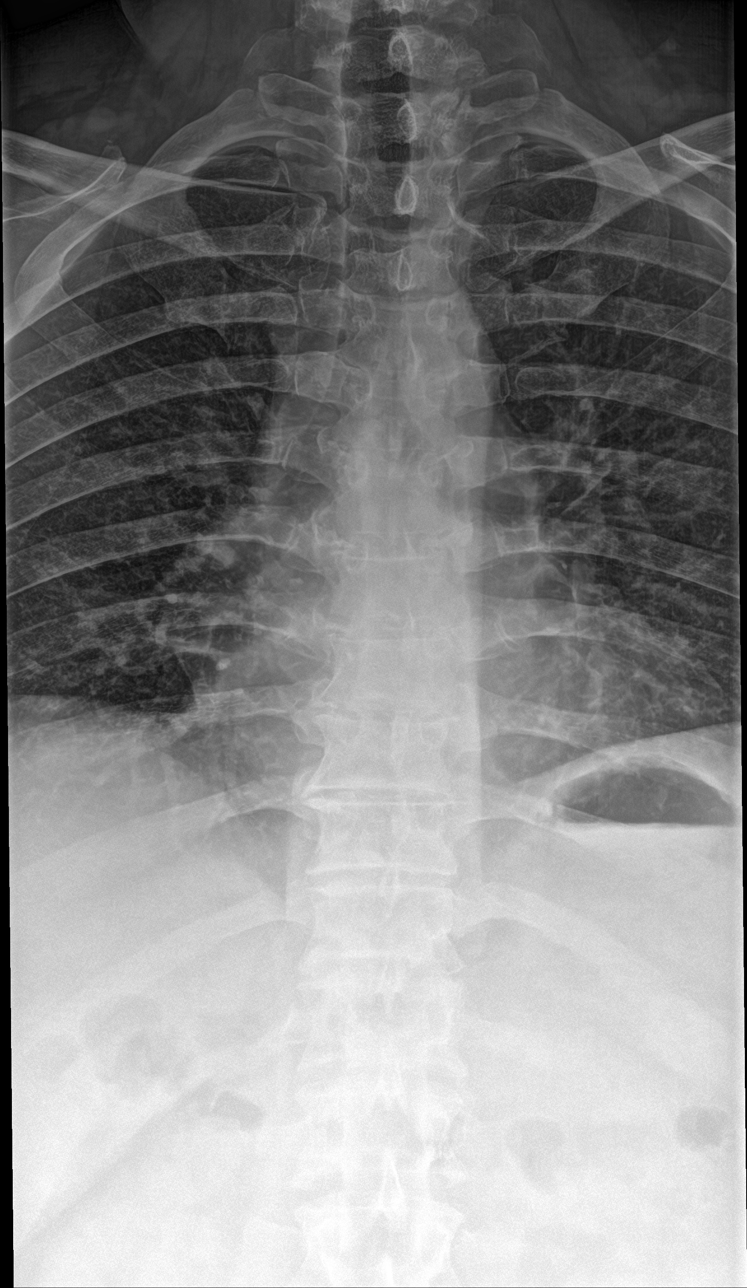

[t-spine lat (1 of 2)]
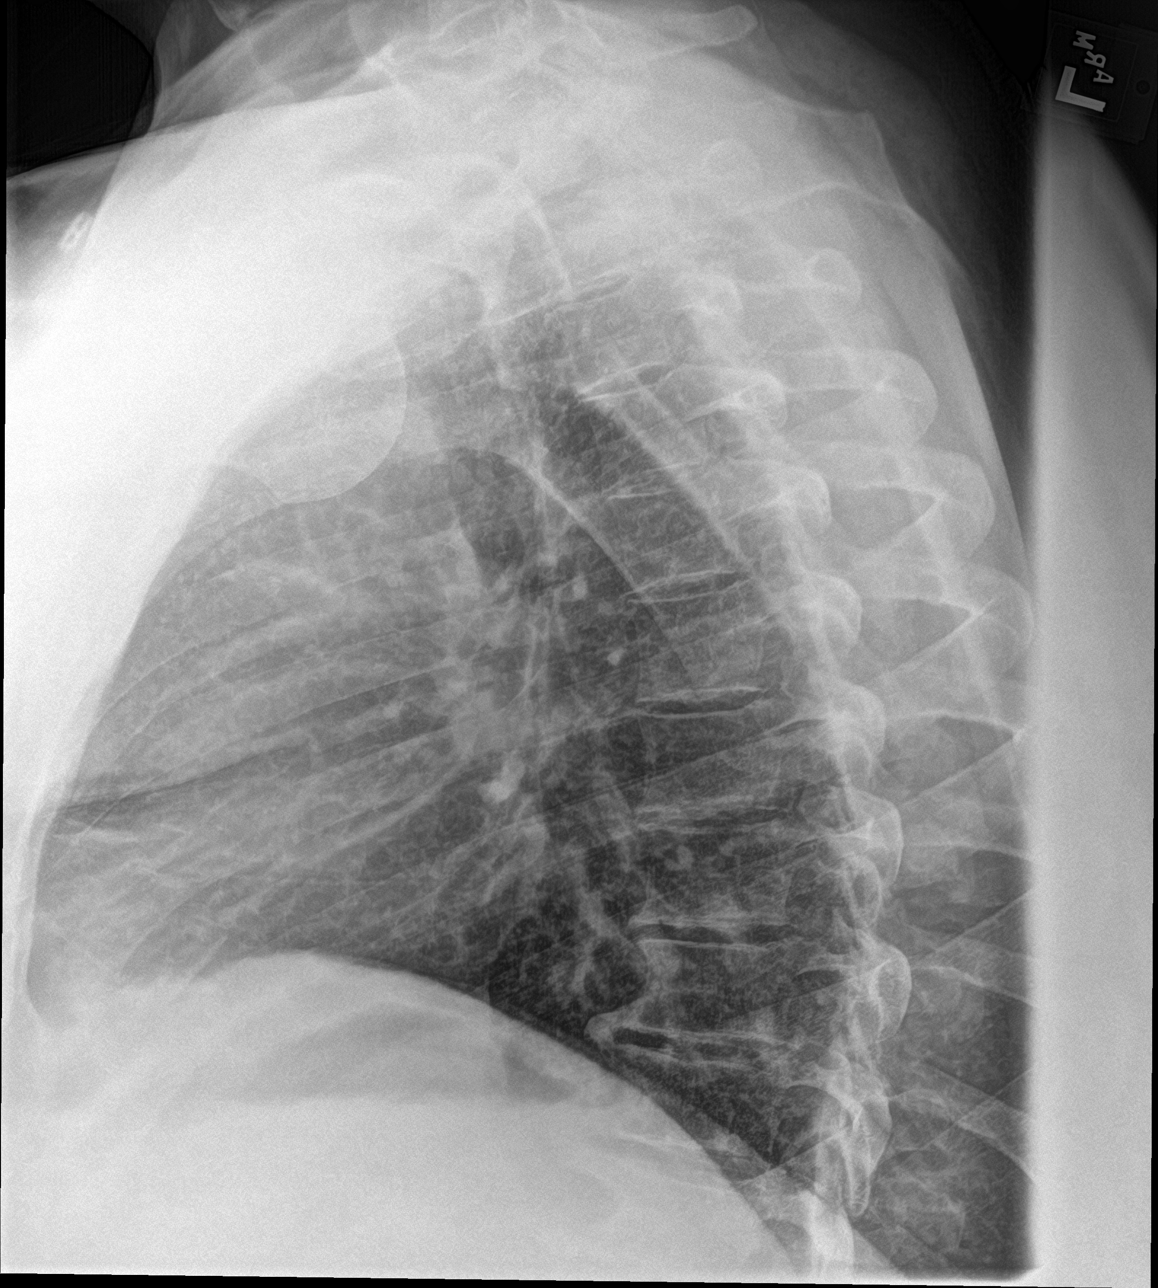

[t-spine swimmers]
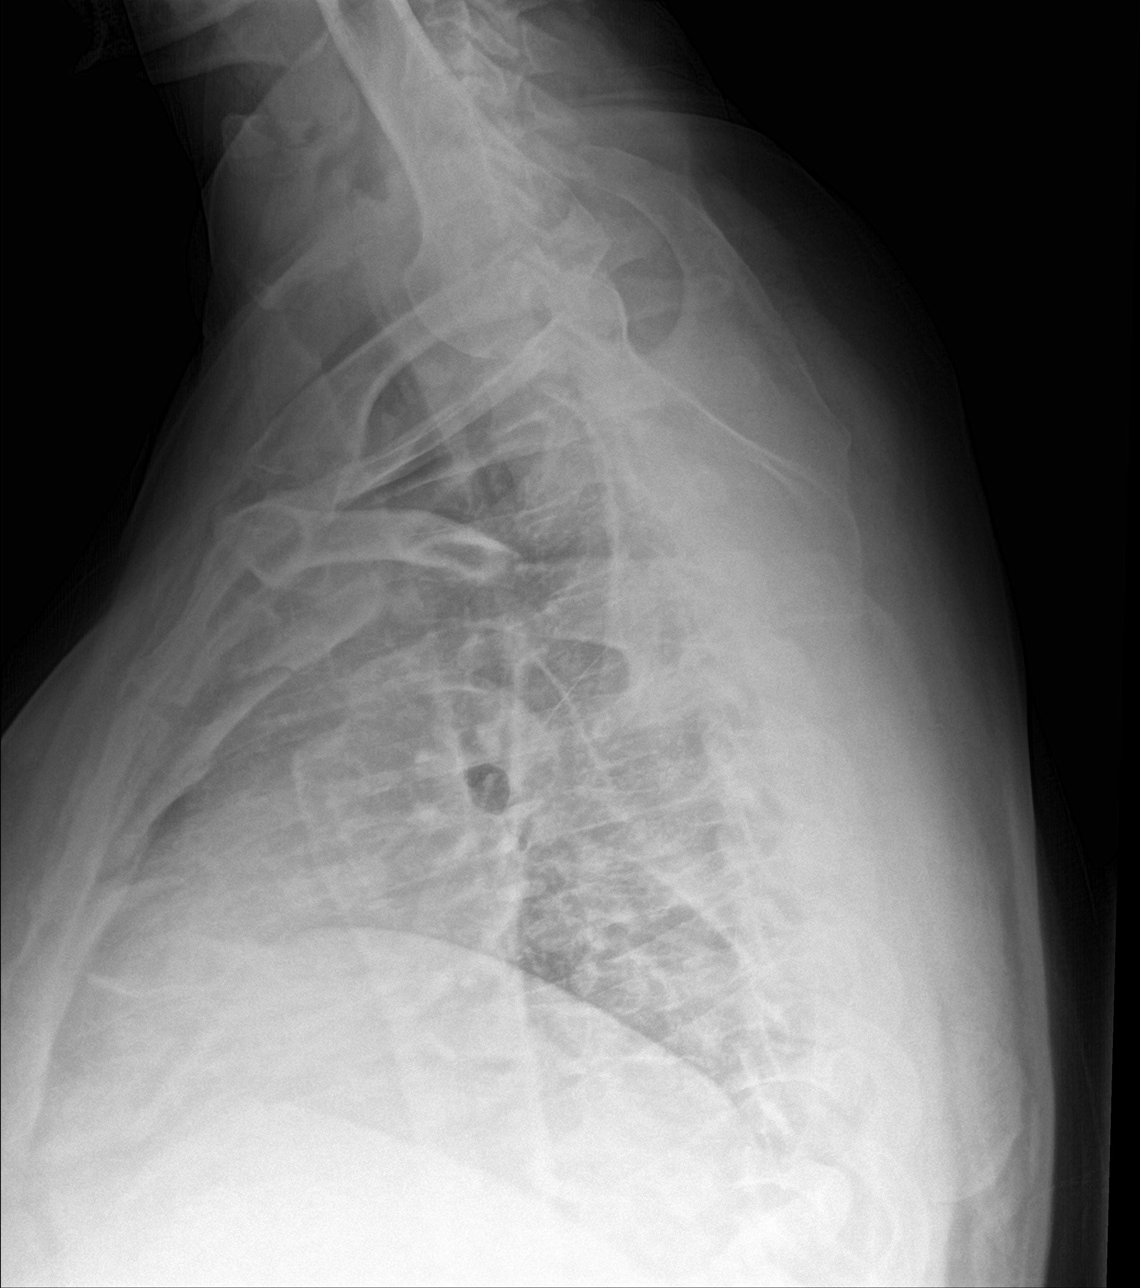

[t-spine lat (2 of 2)]
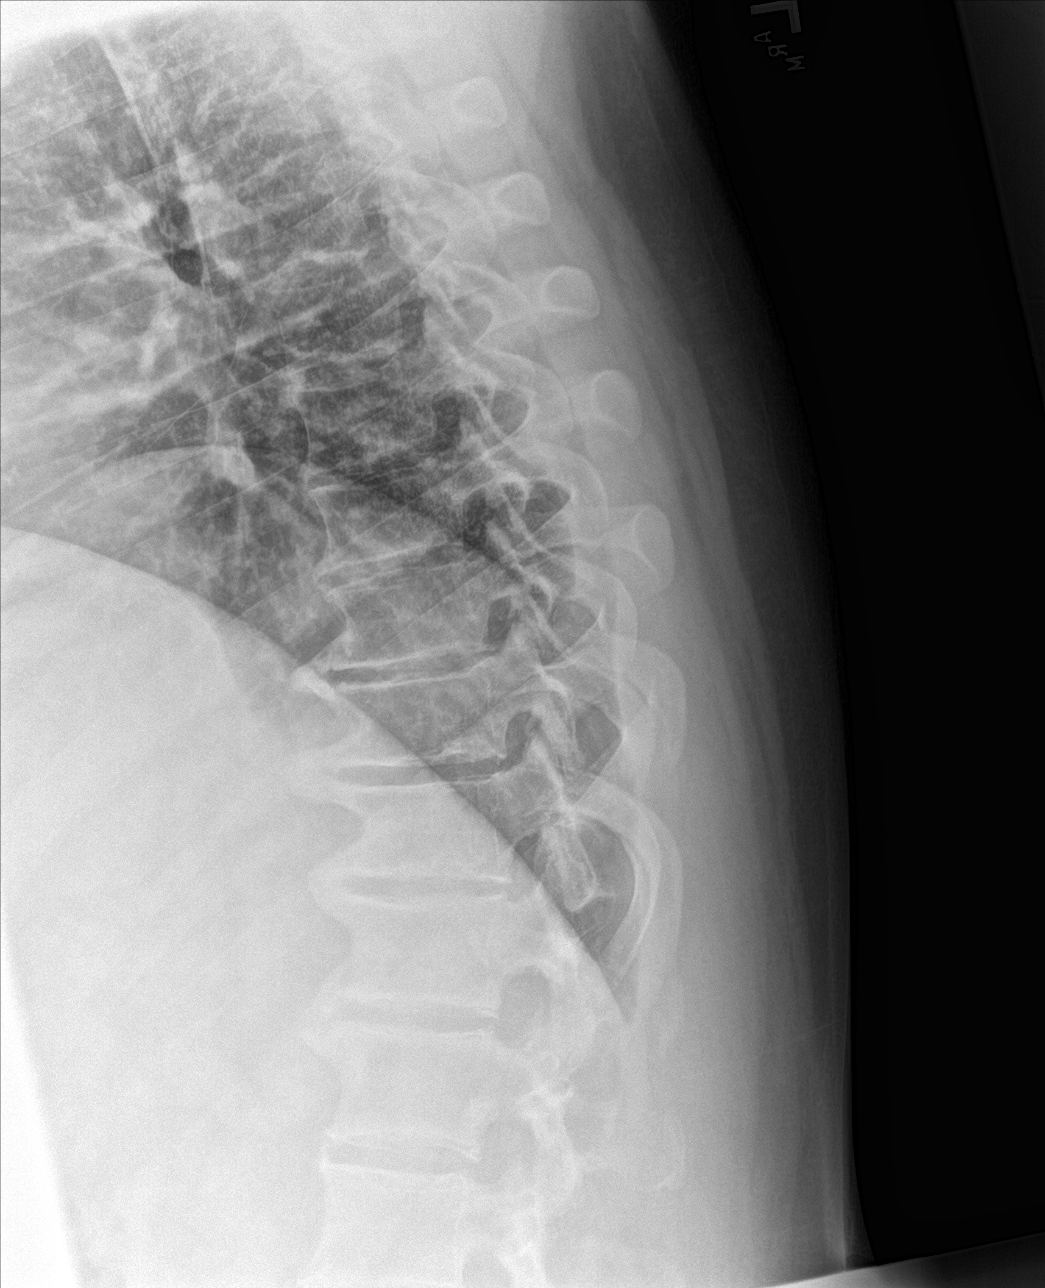

[4 of 4 positions shown; findings below may reference images not displayed]

FINDINGS: There is no evidence of fracture or subluxation. Vertebral bodies
demonstrate normal height and alignment. Intervertebral disc spaces
are preserved. A few lateral osteophytes are noted along the
thoracic spine.

The visualized portions of both lungs are clear. The mediastinum is
unremarkable in appearance.
IMPRESSION: Negative.

## 2016-07-31 ENCOUNTER — Ambulatory Visit (INDEPENDENT_AMBULATORY_CARE_PROVIDER_SITE_OTHER): Payer: BLUE CROSS/BLUE SHIELD | Admitting: Internal Medicine

## 2016-07-31 ENCOUNTER — Encounter (INDEPENDENT_AMBULATORY_CARE_PROVIDER_SITE_OTHER): Payer: Self-pay | Admitting: Internal Medicine

## 2016-07-31 VITALS — BP 116/77 | HR 77 | Temp 98.0°F | Resp 18 | Ht 72.0 in | Wt 293.8 lb

## 2016-07-31 DIAGNOSIS — R51 Headache: Secondary | ICD-10-CM

## 2016-07-31 DIAGNOSIS — R110 Nausea: Secondary | ICD-10-CM

## 2016-07-31 DIAGNOSIS — Z23 Encounter for immunization: Secondary | ICD-10-CM

## 2016-07-31 DIAGNOSIS — R05 Cough: Secondary | ICD-10-CM

## 2016-07-31 DIAGNOSIS — R109 Unspecified abdominal pain: Secondary | ICD-10-CM

## 2016-07-31 MED ORDER — OMEPRAZOLE 20 MG PO CPDR
20.0000 mg | DELAYED_RELEASE_CAPSULE | Freq: Every day | ORAL | 0 refills | Status: DC
Start: 2016-07-31 — End: 2016-08-14

## 2016-07-31 MED ORDER — ONDANSETRON HCL 4 MG PO TABS
4.0000 mg | ORAL_TABLET | Freq: Three times a day (TID) | ORAL | 1 refills | Status: DC | PRN
Start: 2016-07-31 — End: 2016-10-10

## 2016-07-31 NOTE — Progress Notes (Signed)
(  Have you seen any specialists/other providers since your last visit with [place]?    No      Limb alert protocol reviewed?      Yes    Patient is here with nausea, headache and abdominal pain since Monday.  No current medications on file. Patient would like to receive influenza vaccine.     Patient received influenza vaccine IM in Right Deltoid. Patient tolerated well with no adverse effects and was able to leave the office

## 2016-07-31 NOTE — Progress Notes (Signed)
Subjective:      Date: 07/31/2016 8:37 AM   Patient ID: Leon Scott is a 45 y.o. male.    Chief Complaint:  Chief Complaint   Patient presents with   . Nausea   . Headache   . Abdominal Pain   . Flu Vaccine       HPI:  HPI  He started feeling bad Monday night, sweating, nausea, upset stomach  ? Fever overnight, feels crappy, pressure in head, quizzy feeling in stomach, no vomiting  No diarrhea or constipation  Niece was sick? Symptoms  Little dry cough, no sob, no cp or palpitations  Able to eat some soaps and apple juice, has not tried any medication  He would like to get the flu shot today  Problem List:  There is no problem list on file for this patient.      Current Medications:  No current outpatient prescriptions on file.     No current facility-administered medications for this visit.        Allergies:  Allergies   Allergen Reactions   . 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 No pertinent past medical history.    Past Surgical History:  Past Surgical History:   Procedure Laterality Date   . APPENDECTOMY     . HERNIA REPAIR     . ROTATOR CUFF REPAIR Right    . SHOULDER SURGERY Left        Family History:  Family History   Problem Relation Age of Onset   . No known problems Mother    . No known problems Father    . Cancer Maternal Grandfather      ? source   . Heart disease Neg Hx        Social History:  Social History     Social History   . Marital status: Married     Spouse name: N/A   . Number of children: N/A   . Years of education: N/A     Occupational History   . Not on file.     Social History Main Topics   . Smoking status: Current Every Day Smoker     Packs/day: 0.25     Years: 30.00   . Smokeless tobacco: Never Used   . Alcohol use Yes      Comment: rarely   . Drug use: No   . Sexual activity: Yes     Partners: Female     Other Topics Concern   . Not on file     Social History Narrative   . No narrative on file       The following portions of the patient's  history were reviewed and updated as appropriate: allergies, current medications, past family history, past medical history, past social history, past surgical history and problem list.        Review of Systems   Constitutional: Negative for chills.   Respiratory: Positive for cough. Negative for shortness of breath.    Cardiovascular: Negative for chest pain and palpitations.   Gastrointestinal: Positive for nausea. Negative for blood in stool, constipation and diarrhea.   Neurological: Positive for headaches. Negative for dizziness.         Vitals:  BP 116/77 (BP Site: Right arm, Patient Position: Sitting)   Pulse 77   Temp 98 F (36.7 C) (Oral)   Resp 18   Ht 1.829 m (6')   Wt  133.3 kg (293 lb 12.8 oz)   SpO2 99%   BMI 39.85 kg/m           Objective:       Physical Exam:  Physical Exam   Constitutional: He appears well-developed and well-nourished. No distress.   HENT:   Head: Normocephalic and atraumatic.   Mouth/Throat: Oropharynx is clear and moist.   Eyes: Conjunctivae and EOM are normal. Pupils are equal, round, and reactive to light.   Neck: Normal range of motion. Neck supple.   Cardiovascular: Normal rate, regular rhythm and normal heart sounds.  Exam reveals no friction rub.    No murmur heard.  Pulmonary/Chest: Effort normal and breath sounds normal. No respiratory distress. He has no wheezes. He has no rales.   Abdominal: Soft. Bowel sounds are normal. He exhibits no distension and no mass. There is no tenderness. There is no rebound and no guarding.   Lymphadenopathy:     He has no cervical adenopathy.   Skin: He is not diaphoretic.           Assessment/Plan:       Rishi was seen today for nausea, headache, abdominal pain and flu vaccine.    Diagnoses and all orders for this visit:    Nausea  Comments:  viral infection, symptomatic therapy, liquid diet for 24 hrs then advance as tolerated  Orders:  -     ondansetron (ZOFRAN) 4 MG tablet; Take 1 tablet (4 mg total) by mouth every 8 (eight)  hours as needed for Nausea.  -     omeprazole (PRILOSEC) 20 MG capsule; Take 1 capsule (20 mg total) by mouth daily.    Need for vaccination  -     Flu vacc QUAD PRES FREE 3 YRS & GREATER          Gus Height, MD

## 2016-07-31 NOTE — Patient Instructions (Signed)
Viral Gastroenteritis (Adult)    Gastroenteritis is commonly called the stomach flu. It is most often caused by a virus that affects the stomach and intestinal tract and usually lasts from 2 to 7 days. Common viruses causing gastroenteritis include norovirus, rotavirus, and hepatitis A. Non-viral causes of gastroenteritis include bacteria, parasites, and toxins.  The danger from repeated vomiting or diarrhea is dehydration. This is the loss of toomuch fluid from the body. When this occurs, body fluids must be replaced. Antibiotics do not help with this illness because it is usually viral.Simple home treatment will be helpful.  Symptoms of viral gastroenteritis may include:   Watery, loose stools   Stomach pain or abdominal cramps   Fever and chills   Nausea and vomiting   Loss of bowel control   Headache  Home care  Gastroenteritis is transmitted by contact with the stool or vomit of an infected person. This can occur from person to person or from contact with a contaminated surface.  Follow these guidelines when caring for yourself at home:   If symptoms are severe, rest at home for the next 24 hours or until you are feeling better.   Wash your hands with soap and water or use alcohol-based sanitizer to prevent the spread of infection. Wash your hands after touching anyone who is sick.   Wash your hands or use alcohol-based sanitizer after using the toilet and before meals. Clean the toilet after each use.  Remember these tips when preparing food:   People with diarrhea should not prepare or serve foodto others. When preparing foods, wash your hands before and after.   Wash your hands after using cutting boards, countertops, knives, or utensilsthat have been in contact with raw food.   Keep uncooked meats away from cooked and ready-to-eat foods.  Medicine  You may use acetaminophen or NSAID medicines like ibuprofen or naproxen to control fever unless another medicine was given. If you have chronic  liver or kidney disease, talk with your healthcare provider before using these medicines. Also talk with your provider if you'vehad a stomach ulcer orgastrointestinal bleeding. Don't give aspirinto anyone under 18 years of age who is ill with a fever. It may cause severe liver damage. Don't use NSAIDS is you are already taking one for another condition (like arthritis) or are on aspirin (such as for heart disease or after a stroke).  If medicine for vomiting or diarrhea are prescribed, take these only as directed. Do not take over-the-counter medicines for vomiting or diarrhea unless instructed by your healthcare provider.  Diet  Follow these guidelines forfood:   Water and liquids are important so you don't get dehydrated. Drink a small amount at a time or suck on ice chips if you are vomiting.   If you eat, avoid fatty, greasy, spicy, or fried foods.   Don't eat dairy if you have diarrhea. This can make diarrhea worse.   Avoid tobacco, alcohol, and caffeine which may worsen symptoms.  During the first 24 hours (the first full day), follow the diet below:   Beverages. Sports drinks, soft drinks without caffeine, ginger ale, mineral water (plain or flavored), decaffeinated tea and coffee. If you are very dehydrated, sports drinks aren't a good choice. They have too much sugar and not enough electrolytes. In this case, commercially available products called oral rehydration solutions, are best.   Soups. Eat clear broth, consomm, and bouillon.   Desserts. Eat gelatin, popsicles, and fruit juice bars.  During the next 24   hours (the second day), you may add the following to the above:   Hot cereal, plain toast, bread, rolls, and crackers   Plain noodles, rice, mashed potatoes, chicken noodle or rice soup   Unsweetened canned fruit (avoid pineapple), bananas   Limit fat intake to less than 15 grams per day. Do this by avoiding margarine, butter, oils, mayonnaise, sauces, gravies, fried foods, peanut  butter, meat, poultry, and fish.   Limit fiber and avoid raw or cooked vegetables, fresh fruits (except bananas), and bran cereals.   Limit caffeine and chocolate. Don't use spices or seasonings other than salt.   Limit dairy products.   Avoid alcohol.  During the next 24 hours:   Gradually resume a normal diet as you feel better and your symptoms improve.   If at any time it starts getting worse again, go back to clear liquids until you feel better.  Follow-up care  Follow up with your healthcare provider, oras advised. Call your provider if you don't get better within 24 hours or if diarrhea lasts more than a week. Also follow up if you are unable to keep down liquids and get dehydrated. If a stool (diarrhea) sample was taken, call as directed for the results.  Call 911  Call 911 if any of these occur:   Trouble breathing   Chest pain   Confused   Severe drowsiness or trouble awakening   Fainting or loss of consciousness   Rapid heart rate   Seizure   Stiff neck  When to seek medical advice  Call your healthcare provider right away if any of these occur:   Abdominal pain that gets worse   Continued vomiting (unable to keep liquids down)   Frequent diarrhea (more than 5 times a day)   Blood in vomit or stool (black or red color)   Dark urine, reduced urine output, or extreme thirst   Weakness or dizziness   Drowsiness   Fever of 100.4F (38C) oral or higher that does not get better with fever medicine   New rash  Date Last Reviewed: 10/09/2014   2000-2016 [employer] [address], [place] [address]. All rights reserved. This information is not intended as a substitute for professional medical care. Always follow your healthcare professional's instructions.

## 2016-08-14 ENCOUNTER — Other Ambulatory Visit (INDEPENDENT_AMBULATORY_CARE_PROVIDER_SITE_OTHER): Payer: Self-pay

## 2016-08-14 DIAGNOSIS — R110 Nausea: Secondary | ICD-10-CM

## 2016-08-14 MED ORDER — OMEPRAZOLE 20 MG PO CPDR
20.0000 mg | DELAYED_RELEASE_CAPSULE | Freq: Every day | ORAL | 0 refills | Status: DC
Start: 2016-08-14 — End: 2016-10-10

## 2016-10-10 ENCOUNTER — Encounter (INDEPENDENT_AMBULATORY_CARE_PROVIDER_SITE_OTHER): Payer: Self-pay | Admitting: Family Medicine

## 2016-10-10 ENCOUNTER — Ambulatory Visit (INDEPENDENT_AMBULATORY_CARE_PROVIDER_SITE_OTHER): Payer: BLUE CROSS/BLUE SHIELD | Admitting: Family Medicine

## 2016-10-10 VITALS — BP 137/79 | HR 93 | Temp 97.9°F | Resp 16 | Ht 72.0 in | Wt 290.0 lb

## 2016-10-10 DIAGNOSIS — B349 Viral infection, unspecified: Secondary | ICD-10-CM

## 2016-10-10 LAB — CBC AND DIFFERENTIAL
Absolute NRBC: 0 10*3/uL
Basophils Absolute Automated: 0.06 10*3/uL (ref 0.00–0.20)
Basophils Automated: 0.7 %
Eosinophils Absolute Automated: 0.22 10*3/uL (ref 0.00–0.70)
Eosinophils Automated: 2.4 %
Hematocrit: 46.4 % (ref 42.0–52.0)
Hgb: 15.4 g/dL (ref 13.0–17.0)
Immature Granulocytes Absolute: 0.04 10*3/uL
Immature Granulocytes: 0.4 %
Lymphocytes Absolute Automated: 2.47 10*3/uL (ref 0.50–4.40)
Lymphocytes Automated: 27.5 %
MCH: 30.4 pg (ref 28.0–32.0)
MCHC: 33.2 g/dL (ref 32.0–36.0)
MCV: 91.7 fL (ref 80.0–100.0)
MPV: 12.6 fL — ABNORMAL HIGH (ref 9.4–12.3)
Monocytes Absolute Automated: 0.53 10*3/uL (ref 0.00–1.20)
Monocytes: 5.9 %
Neutrophils Absolute: 5.66 10*3/uL (ref 1.80–8.10)
Neutrophils: 63.1 %
Nucleated RBC: 0 /100 WBC (ref 0.0–1.0)
Platelets: 196 10*3/uL (ref 140–400)
RBC: 5.06 10*6/uL (ref 4.70–6.00)
RDW: 13 % (ref 12–15)
WBC: 8.98 10*3/uL (ref 3.50–10.80)

## 2016-10-10 LAB — COMPREHENSIVE METABOLIC PANEL
ALT: 26 U/L (ref 0–55)
AST (SGOT): 12 U/L (ref 5–34)
Albumin/Globulin Ratio: 1.1 (ref 0.9–2.2)
Albumin: 3.6 g/dL (ref 3.5–5.0)
Alkaline Phosphatase: 41 U/L (ref 38–106)
BUN: 19 mg/dL (ref 9.0–28.0)
Bilirubin, Total: 0.4 mg/dL (ref 0.1–1.2)
CO2: 23 mEq/L (ref 21–29)
Calcium: 9.7 mg/dL (ref 8.5–10.5)
Chloride: 105 mEq/L (ref 100–111)
Creatinine: 1.2 mg/dL (ref 0.5–1.5)
Globulin: 3.2 g/dL (ref 2.0–3.7)
Glucose: 297 mg/dL — ABNORMAL HIGH (ref 70–100)
Potassium: 4.3 mEq/L (ref 3.5–5.1)
Protein, Total: 6.8 g/dL (ref 6.0–8.3)
Sodium: 138 mEq/L (ref 136–145)

## 2016-10-10 LAB — POCT INFLUENZA A/B
POCT Rapid Influenza A AG: NEGATIVE
POCT Rapid Influenza B AG: NEGATIVE

## 2016-10-10 LAB — GFR: EGFR: >60

## 2016-10-10 LAB — HEMOLYSIS INDEX: Hemolysis Index: 17 (ref 0–18)

## 2016-10-10 MED ORDER — OSELTAMIVIR PHOSPHATE 75 MG PO CAPS
75.0000 mg | ORAL_CAPSULE | Freq: Two times a day (BID) | ORAL | 0 refills | Status: AC
Start: 2016-10-10 — End: ?

## 2016-10-10 NOTE — Patient Instructions (Signed)
Take the tamiflu as directed    Recheck if not feeling better or worse over next week

## 2016-10-10 NOTE — Progress Notes (Signed)
Subjective:      Patient ID: Leon Scott is a 46 y.o. male.    Chief Complaint:  Chief Complaint   Patient presents with   . URI     congestion/nausea       HPI:  HPI  The following portions of the patient's history were reviewed and updated as appropriate:  allergies,current medications, past family history , past medical history, past social history, past surgical history and problem list     Tuesday  Light headed, sweats   Initially felt well then symptoms  Took off wednsday  Awoke today and felt better, tried to go to work but symptoms returned   A little cough  Some head congestion  Fatigued   No chest pain no sob  Some headache  Some  nausea no vomiting  No diarrhea  He has had flu vaccine   No specific people he is aware that are sick   Nausea and lightheadedness comes and goes  Very tired   Similar episode in Oct , see notes     Problem List:  There is no problem list on file for this patient.      Current Medications:  No current outpatient prescriptions on file.     No current facility-administered medications for this visit.        Allergies:  Allergies   Allergen Reactions   . 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 No pertinent past medical history.    Past Surgical History:  Past Surgical History:   Procedure Laterality Date   . APPENDECTOMY     . HERNIA REPAIR     . ROTATOR CUFF REPAIR Right    . SHOULDER SURGERY Left        Family History:  Family History   Problem Relation Age of Onset   . No known problems Mother    . No known problems Father    . Cancer Maternal Grandfather      ? source   . Heart disease Neg Hx        Social History:  Social History     Social History   . Marital status: Married     Spouse name: N/A   . Number of children: N/A   . Years of education: N/A     Occupational History   . Not on file.     Social History Main Topics   . Smoking status: Current Every Day Smoker     Packs/day: 0.25     Years: 30.00   . Smokeless tobacco: Never  Used   . Alcohol use Yes      Comment: rarely   . Drug use: No   . Sexual activity: Yes     Partners: Female     Other Topics Concern   . Not on file     Social History Narrative   . No narrative on file       The following sections were reviewed this encounter by the provider:   Tobacco  Allergies  Meds  Problems  Med Hx  Surg Hx         ROS:  Review of Systems   General constitutional per hpi  Ophthalmologic  Denies blurred vision, change in vision  Cardiac: Denies chest pain, Denies sob Denies palpitations  Pulm:  Denies  sob ,occasional   cough, Denies  wheezing  Vasc: Denies  edema  GI Denies  change in stool, Denies  abdominal discomfort,Denies constipation, Denies vomiting , positive nausea  Neurologic:pos dizziness/ lightheadedness , mild  headaches,   See HPI  otherwise ros is negative       Vitals:  BP 137/79   Pulse 93   Temp 97.9 F (36.6 C) (Oral)   Resp 16   Ht 1.829 m (6')   Wt 131.5 kg (290 lb)   SpO2 97%   BMI 39.33 kg/m      Objective:     Physical Exam:  Physical Exam   PHYSICAL EXAMINATION     WD/WN in NAD  Head: Normocephalic, atraumatic  Ears: EAC's are clear and TM's normal with good light reflex and landmarks  Nose: patent turbinates,mucous membranes moist   Mouth: moist, tongue midline , posterior pharynx is clear without exudates, no lesions noted  Eyes: Pupils are equal and round , EOM's intact, no scleral icterus noted  Neck : supple , no thyromegaly, no lymphadenopathy, no carotid bruits    Lungs: clear to auscultation without wheeze, rales or rhonchi  Heart: regular rate and rhythm, S2S2 without murmur,click or thrill  Abdomen: soft ,benign, non tender, no organomegaly or mass appreciated, no rebound, no rigidity, positive bowel sounds in all four quadrants  Extremities: no edema             Assessment:     1. Viral syndrome  POCT Influenza A/B    CBC and differential    Comprehensive Metabolic Panel        Plan:   Viral syndrome and negative POCT flu  Check cbc  cmp  Fluids rest and tylenol   Start Tamiflu 75 mg bid for 5 days, suggestive of flu and influenza in our area now  Recheck if now change or worse over next week  Note for work given     Kirke Corin, DO

## 2016-10-11 ENCOUNTER — Ambulatory Visit (INDEPENDENT_AMBULATORY_CARE_PROVIDER_SITE_OTHER): Payer: BLUE CROSS/BLUE SHIELD | Admitting: Family Medicine

## 2016-10-11 ENCOUNTER — Encounter (INDEPENDENT_AMBULATORY_CARE_PROVIDER_SITE_OTHER): Payer: Self-pay | Admitting: Family Medicine

## 2016-10-11 VITALS — BP 122/75 | HR 79 | Temp 97.9°F | Resp 15 | Ht 72.0 in | Wt 290.0 lb

## 2016-10-11 DIAGNOSIS — B349 Viral infection, unspecified: Secondary | ICD-10-CM

## 2016-10-11 DIAGNOSIS — R739 Hyperglycemia, unspecified: Secondary | ICD-10-CM

## 2016-10-11 LAB — HEMOGLOBIN A1C
Average Estimated Glucose: 162.8 mg/dL
Hemoglobin A1C: 7.3 % — ABNORMAL HIGH (ref 4.6–5.9)

## 2016-10-11 NOTE — Progress Notes (Signed)
Subjective:      Patient ID: MATIS MONNIER is a 46 y.o. male.    Chief Complaint:  Chief Complaint   Patient presents with   . Results       HPI:  HPI   Elevated blood sugar/ called him and recheck today    History of pre DM in past  Doesn't eat that well,   Has seen nutritionist in the past  And lost weight and did well but has since gone back to old habits   Glucose noted non fasting /  297   Will add [facility] to yesterdays labs   Feeling better from yesterday    Weight : 290 now   Had been 230 and felt well in past       Problem List:  There is no problem list on file for this patient.      Current Medications:  Current Outpatient Prescriptions   Medication Sig Dispense Refill   . oseltamivir (TAMIFLU) 75 MG capsule Take 1 capsule (75 mg total) by mouth every 12 (twelve) hours. 10 capsule 0     No current facility-administered medications for this visit.        Allergies:  Allergies   Allergen Reactions   . 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 No pertinent past medical history.    Past Surgical History:  Past Surgical History:   Procedure Laterality Date   . APPENDECTOMY     . HERNIA REPAIR     . ROTATOR CUFF REPAIR Right    . SHOULDER SURGERY Left        Family History:  Family History   Problem Relation Age of Onset   . No known problems Mother    . No known problems Father    . Cancer Maternal Grandfather      ? source   . Heart disease Neg Hx        Social History:  Social History     Social History   . Marital status: Married     Spouse name: N/A   . Number of children: N/A   . Years of education: N/A     Occupational History   . Not on file.     Social History Main Topics   . Smoking status: Current Every Day Smoker     Packs/day: 0.25     Years: 30.00   . Smokeless tobacco: Never Used   . Alcohol use Yes      Comment: rarely   . Drug use: No   . Sexual activity: Yes     Partners: Female     Other Topics Concern   . Not on file     Social History Narrative   . No  narrative on file       The following sections were reviewed this encounter by the provider:   Tobacco  Allergies  Meds  Problems  Med Hx  Surg Hx         ROS:  Review of Systems  See HPI   Vitals:  BP 122/75   Pulse 79   Temp 97.9 F (36.6 C) (Oral)   Resp 15   Ht 1.829 m (6')   Wt 131.5 kg (290 lb)   SpO2 98%   BMI 39.33 kg/m      Objective:     Physical Exam:  Physical Exam    PHYSICAL EXAMINATION  WD/WN in NAD  Head: Normocephalic, atraumatic  Mouth: moist, tongue midline , posterior pharynx is clear without exudates, no lesions noted  Eyes: Pupils are equal and round , EOM's intact, no scleral icterus noted  Neck : supple , no thyromegaly, no lymphadenopathy   Lungs: clear to auscultation without wheeze, rales or rhonchi  Heart: regular rate and rhythm, S2S2 without murmur,click or thrill  Extremities: no edema          Assessment:     1. Hyperglycemia  Hemoglobin A1C   2. Viral syndrome          Plan:   Check Hga[facility]  nutirition info on DM given to patient  He has his old linformation as well   He has accuchek and will start checking prn  Weight loss , diet and exercise  Recheck in 2 months       Kirke Corin, DO

## 2016-10-11 NOTE — Progress Notes (Signed)
Have you seen any specialists/other providers since your last visit with us?    none      Limb alert protocol reviewed?    yes

## 2016-10-14 ENCOUNTER — Ambulatory Visit (INDEPENDENT_AMBULATORY_CARE_PROVIDER_SITE_OTHER): Payer: BLUE CROSS/BLUE SHIELD | Admitting: Family Medicine
